# Patient Record
Sex: Female | Born: 1980 | Race: Black or African American | Hispanic: No | Marital: Single | Smoking: Never smoker
Health system: Southern US, Community
[De-identification: ages and names within clinical notes are randomized; demographics above are authoritative.]

## PROBLEM LIST (undated history)

## (undated) DIAGNOSIS — Z464 Encounter for fitting and adjustment of orthodontic device: Secondary | ICD-10-CM

## (undated) DIAGNOSIS — IMO0001 Reserved for inherently not codable concepts without codable children: Secondary | ICD-10-CM

## (undated) DIAGNOSIS — T753XXA Motion sickness, initial encounter: Secondary | ICD-10-CM

---

## 2002-01-04 ENCOUNTER — Emergency Department (HOSPITAL_COMMUNITY): Admission: EM | Admit: 2002-01-04 | Discharge: 2002-01-04 | Payer: Self-pay | Admitting: Emergency Medicine

## 2002-01-05 ENCOUNTER — Ambulatory Visit (HOSPITAL_COMMUNITY): Admission: RE | Admit: 2002-01-05 | Discharge: 2002-01-05 | Payer: Self-pay | Admitting: Emergency Medicine

## 2002-02-17 ENCOUNTER — Ambulatory Visit (HOSPITAL_COMMUNITY): Admission: RE | Admit: 2002-02-17 | Discharge: 2002-02-17 | Payer: Self-pay | Admitting: *Deleted

## 2003-03-24 ENCOUNTER — Emergency Department (HOSPITAL_COMMUNITY): Admission: AD | Admit: 2003-03-24 | Discharge: 2003-03-24 | Payer: Self-pay | Admitting: Emergency Medicine

## 2003-09-14 ENCOUNTER — Emergency Department (HOSPITAL_COMMUNITY): Admission: EM | Admit: 2003-09-14 | Discharge: 2003-09-14 | Payer: Self-pay | Admitting: Family Medicine

## 2003-10-06 ENCOUNTER — Emergency Department (HOSPITAL_COMMUNITY): Admission: EM | Admit: 2003-10-06 | Discharge: 2003-10-06 | Payer: Self-pay | Admitting: Family Medicine

## 2003-11-12 ENCOUNTER — Emergency Department (HOSPITAL_COMMUNITY): Admission: EM | Admit: 2003-11-12 | Discharge: 2003-11-12 | Payer: Self-pay | Admitting: Family Medicine

## 2003-11-22 ENCOUNTER — Emergency Department (HOSPITAL_COMMUNITY): Admission: EM | Admit: 2003-11-22 | Discharge: 2003-11-22 | Payer: Self-pay | Admitting: Family Medicine

## 2003-12-29 ENCOUNTER — Emergency Department (HOSPITAL_COMMUNITY): Admission: EM | Admit: 2003-12-29 | Discharge: 2003-12-29 | Payer: Self-pay | Admitting: Emergency Medicine

## 2004-05-16 ENCOUNTER — Emergency Department (HOSPITAL_COMMUNITY): Admission: EM | Admit: 2004-05-16 | Discharge: 2004-05-16 | Payer: Self-pay | Admitting: Family Medicine

## 2005-01-30 ENCOUNTER — Emergency Department (HOSPITAL_COMMUNITY): Admission: EM | Admit: 2005-01-30 | Discharge: 2005-01-30 | Payer: Self-pay | Admitting: Emergency Medicine

## 2005-11-17 IMAGING — CR DG FOOT COMPLETE 3+V*R*
3 series · 3 of 3 positions shown · non-contrast
Comparison: none

CLINICAL DATA: Foot pain.
 RIGHT FOOT - THREE VIEW:

[view not recorded (1 of 3)]
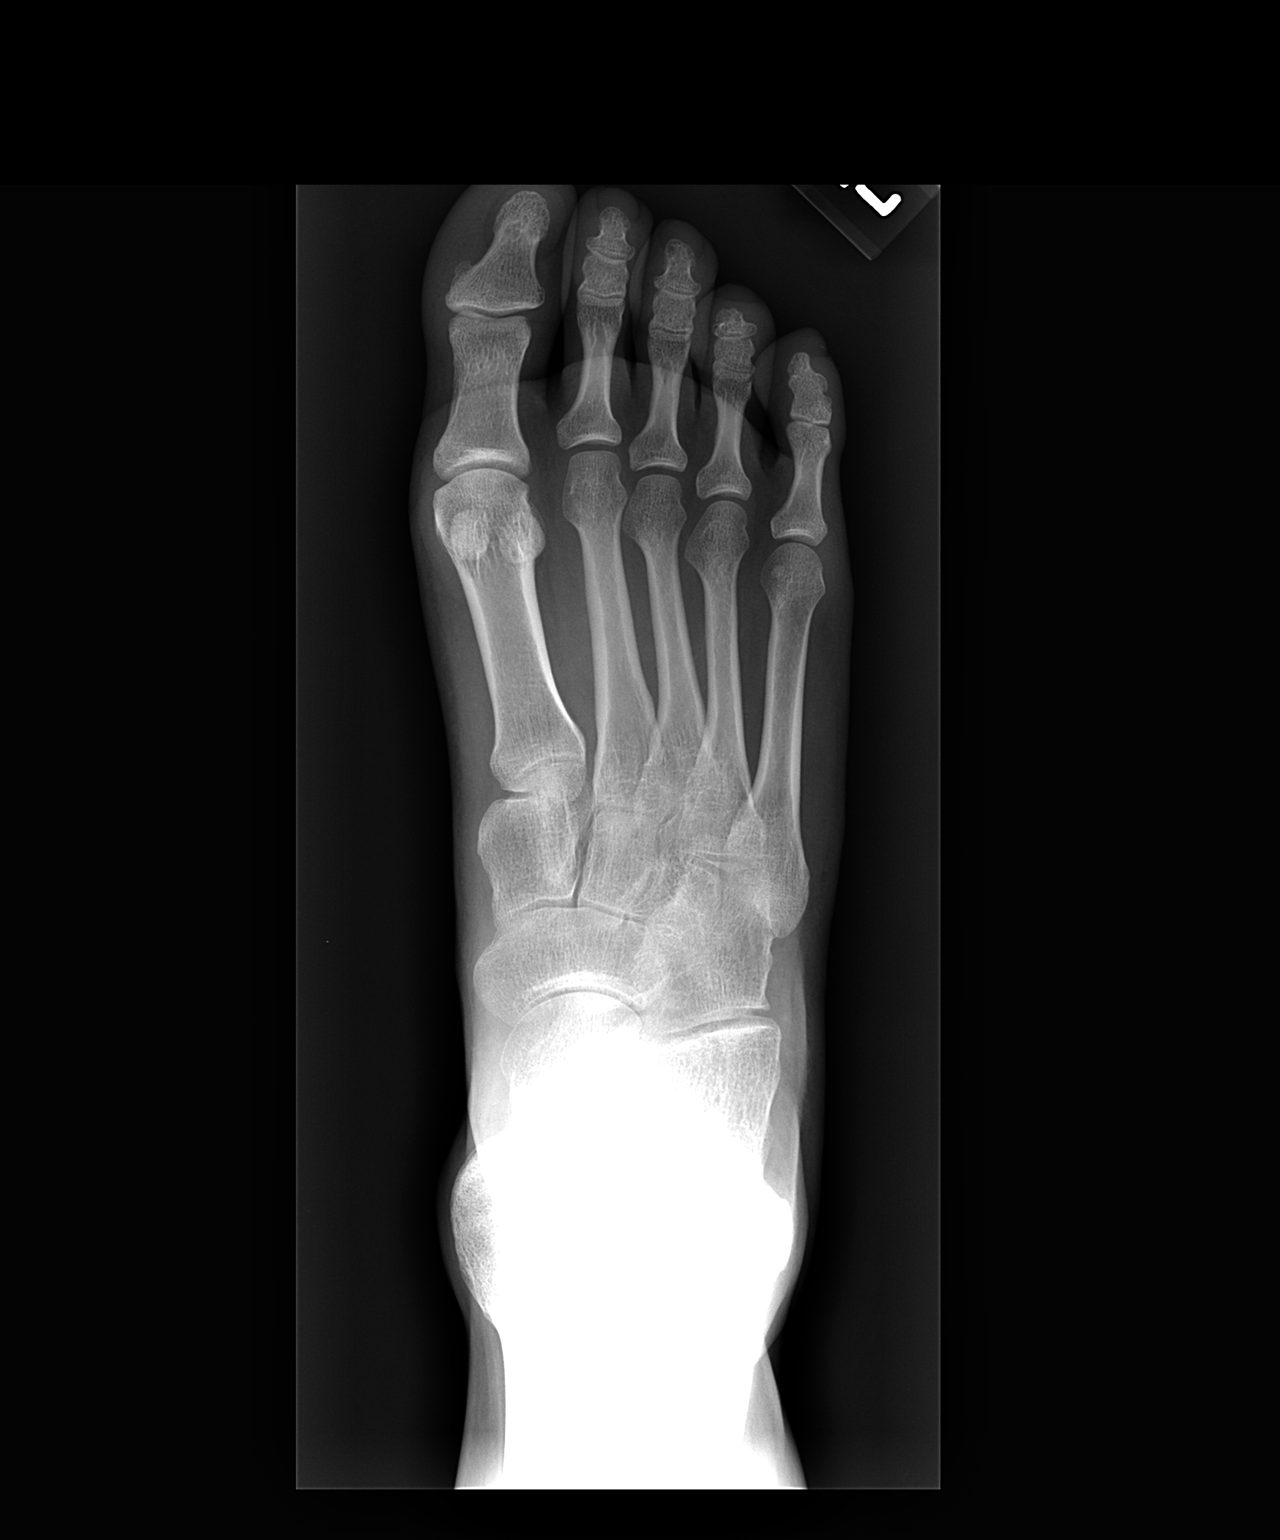

[view not recorded (2 of 3)]
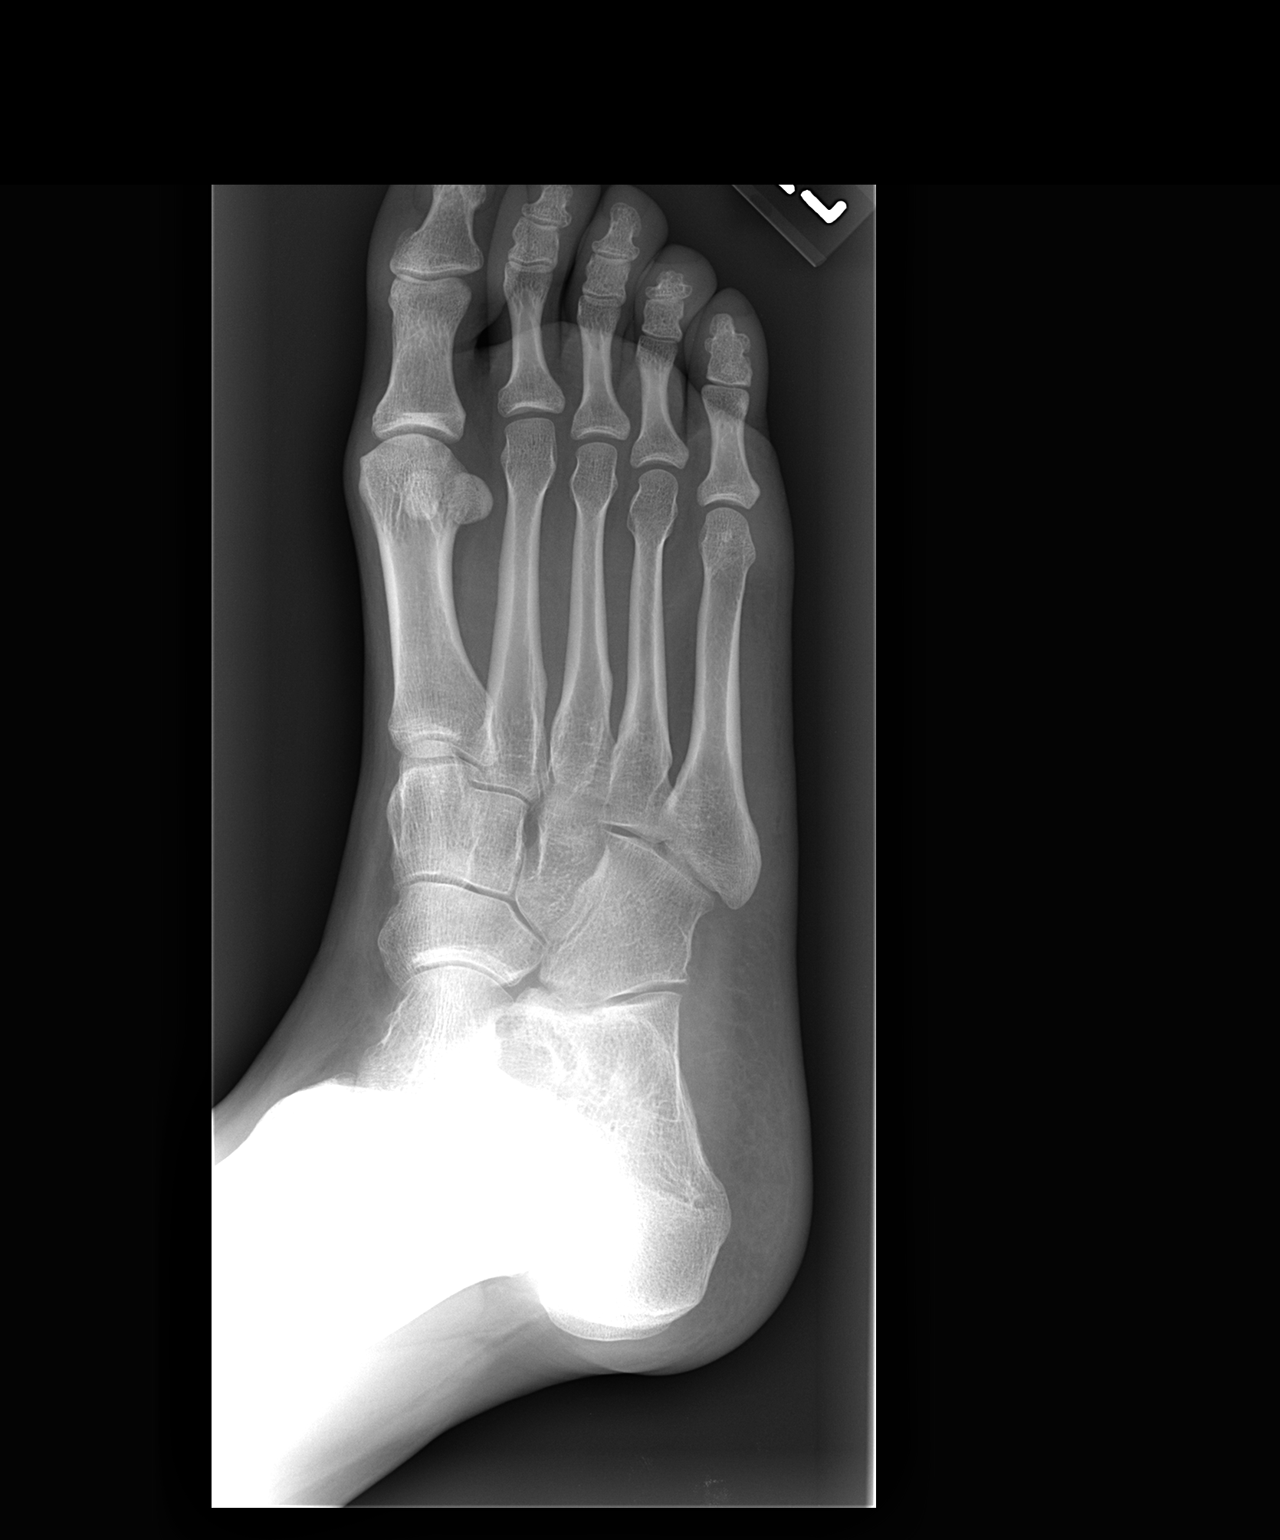

[view not recorded (3 of 3)]
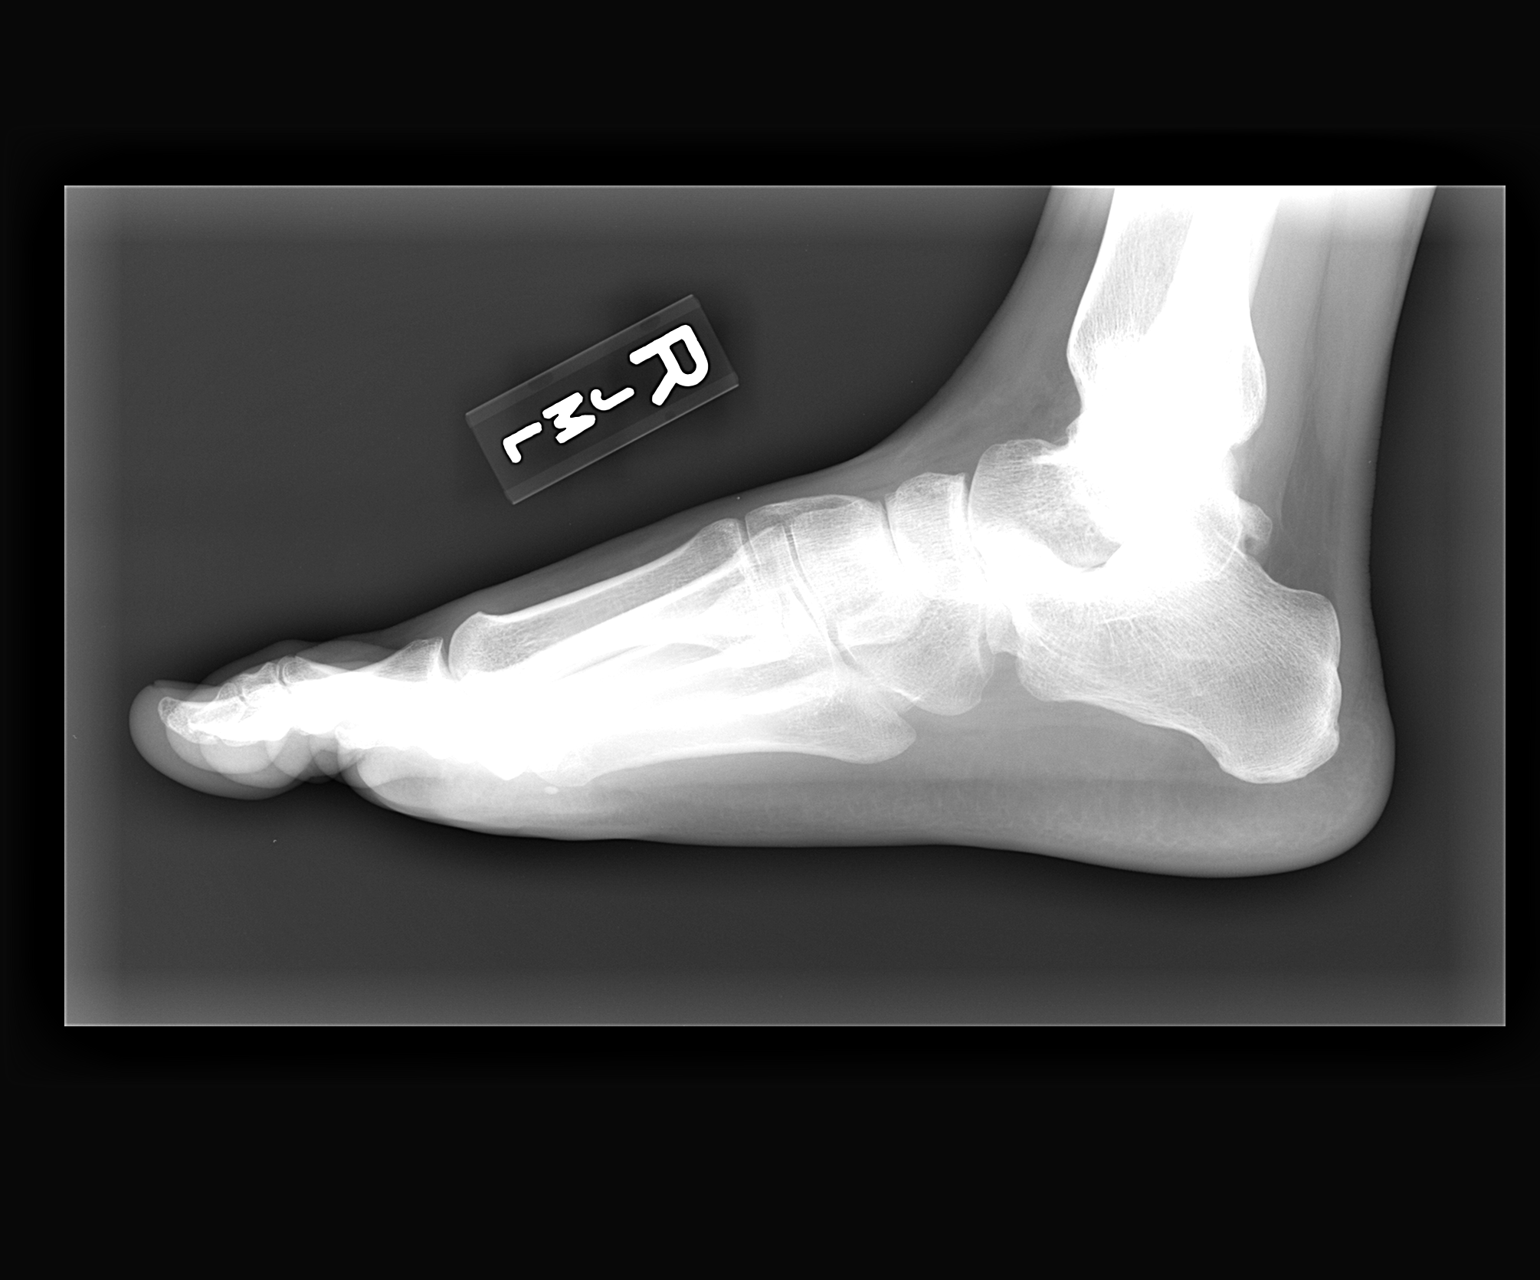

[3 of 3 positions shown; findings below may reference images not displayed]

FINDINGS: No acute fractures or dislocations are seen.  The soft tissues are within normal limits.
IMPRESSION: No evidence of acute bony injury.

## 2014-08-25 ENCOUNTER — Encounter: Payer: Self-pay | Admitting: *Deleted

## 2014-08-26 ENCOUNTER — Ambulatory Visit: Payer: Self-pay | Admitting: Anesthesiology

## 2014-08-26 ENCOUNTER — Ambulatory Visit
Admission: RE | Admit: 2014-08-26 | Discharge: 2014-08-26 | Disposition: A | Discharge status: Home / Self Care | Payer: Self-pay | Source: Ambulatory Visit | Attending: Plastic Surgery | Admitting: Plastic Surgery

## 2014-08-26 ENCOUNTER — Encounter
Admission: RE | Disposition: A | Discharge status: Home / Self Care | Payer: Self-pay | Source: Ambulatory Visit | Attending: Plastic Surgery

## 2014-08-26 DIAGNOSIS — Z882 Allergy status to sulfonamides status: Secondary | ICD-10-CM | POA: Insufficient documentation

## 2014-08-26 DIAGNOSIS — E881 Lipodystrophy, not elsewhere classified: Secondary | ICD-10-CM | POA: Insufficient documentation

## 2014-08-26 DIAGNOSIS — Z411 Encounter for cosmetic surgery: Secondary | ICD-10-CM | POA: Insufficient documentation

## 2014-08-26 HISTORY — PX: LIPOSUCTION: SHX10

## 2014-08-26 HISTORY — DX: Motion sickness, initial encounter: T75.3XXA

## 2014-08-26 HISTORY — DX: Reserved for inherently not codable concepts without codable children: IMO0001

## 2014-08-26 HISTORY — DX: Encounter for fitting and adjustment of orthodontic device: Z46.4

## 2014-08-26 SURGERY — LIPOSUCTION
Anesthesia: General | Wound class: Clean

## 2014-08-26 MED ORDER — LIDOCAINE HCL (PF) 2 % IJ SOLN
INTRAMUSCULAR | Status: DC | PRN
  Administered 2014-08-26 (×2): 25 mL via INTRADERMAL

## 2014-08-26 MED ORDER — DEXAMETHASONE SODIUM PHOSPHATE 4 MG/ML IJ SOLN
INTRAMUSCULAR | Status: DC | PRN
  Administered 2014-08-26: 4 mg via INTRAVENOUS

## 2014-08-26 MED ORDER — EPINEPHRINE HCL 1 MG/ML IJ SOLN
INTRAMUSCULAR | Status: DC | PRN
  Administered 2014-08-26 (×2): 1 mL

## 2014-08-26 MED ORDER — MIDAZOLAM HCL 5 MG/5ML IJ SOLN
INTRAMUSCULAR | Status: DC | PRN
  Administered 2014-08-26 (×2): 2 mg via INTRAVENOUS

## 2014-08-26 MED ORDER — OXYCODONE HCL 5 MG PO TABS: 5.0000 mg | ORAL_TABLET | Freq: Once | ORAL | Status: DC | PRN

## 2014-08-26 MED ORDER — LACTATED RINGERS IV SOLN
INTRAVENOUS | Status: DC
  Administered 2014-08-26 (×4): via INTRAVENOUS

## 2014-08-26 MED ORDER — FENTANYL CITRATE (PF) 100 MCG/2ML IJ SOLN
INTRAMUSCULAR | Status: DC | PRN
Start: 2014-08-26 — End: 2014-08-26
  Administered 2014-08-26: 50 ug via INTRAVENOUS
  Administered 2014-08-26: 100 ug via INTRAVENOUS
  Administered 2014-08-26 (×7): 50 ug via INTRAVENOUS

## 2014-08-26 MED ORDER — SUCCINYLCHOLINE CHLORIDE 20 MG/ML IJ SOLN
INTRAMUSCULAR | Status: DC | PRN
  Administered 2014-08-26: 100 mg via INTRAVENOUS

## 2014-08-26 MED ORDER — OXYCODONE HCL 5 MG/5ML PO SOLN: 5.0000 mg | Freq: Once | ORAL | Status: DC | PRN

## 2014-08-26 MED ORDER — CEFAZOLIN SODIUM 1-5 GM-% IV SOLN
INTRAVENOUS | Status: DC | PRN
  Administered 2014-08-26: 1 g via INTRAVENOUS

## 2014-08-26 MED ORDER — LIDOCAINE HCL (CARDIAC) 20 MG/ML IV SOLN
INTRAVENOUS | Status: DC | PRN
  Administered 2014-08-26: 50 mg via INTRAVENOUS

## 2014-08-26 MED ORDER — PROPOFOL 10 MG/ML IV BOLUS
INTRAVENOUS | Status: DC | PRN
  Administered 2014-08-26: 50 mg via INTRAVENOUS
  Administered 2014-08-26: 200 mg via INTRAVENOUS
  Administered 2014-08-26: 50 mg via INTRAVENOUS

## 2014-08-26 MED ORDER — HYDROMORPHONE HCL 1 MG/ML IJ SOLN
0.2500 mg | INTRAMUSCULAR | Status: DC | PRN
  Administered 2014-08-26: 0.5 mg via INTRAVENOUS

## 2014-08-26 MED ORDER — ONDANSETRON HCL 4 MG/2ML IJ SOLN
INTRAMUSCULAR | Status: DC | PRN
  Administered 2014-08-26: 4 mg via INTRAVENOUS

## 2014-08-26 MED ORDER — GLYCOPYRROLATE 0.2 MG/ML IJ SOLN
INTRAMUSCULAR | Status: DC | PRN
  Administered 2014-08-26: .1 mg via INTRAVENOUS

## 2014-08-26 MED ORDER — LACTATED RINGERS IV SOLN: 1000.0000 mL | INTRAVENOUS | Status: AC

## 2014-08-26 MED ORDER — PROPOFOL INFUSION 10 MG/ML OPTIME
INTRAVENOUS | Status: DC | PRN
  Administered 2014-08-26: 120 ug/kg/min via INTRAVENOUS

## 2014-08-26 SURGICAL SUPPLY — 36 items
BACTOSHIELD CHG 4% 4OZ (MISCELLANEOUS) ×16
BLADE SURG SZ11 CARB STEEL (BLADE) ×6 IMPLANT
CANISTER SUCT 1200ML W/VALVE (MISCELLANEOUS) ×3 IMPLANT
CANISTER SUCT 3000ML PPV (MISCELLANEOUS) ×5 IMPLANT
CLOSURE WOUND 1/2 X4 (GAUZE/BANDAGES/DRESSINGS) ×4
DRAPE LAPAROSCOPIC ABDOMINAL (DRAPES) ×3 IMPLANT
DRAPE ORTHO SPLIT 77X108 STRL (DRAPES) ×12
DRAPE SHEET LG 3/4 BI-LAMINATE (DRAPES) ×12 IMPLANT
DRAPE SURG ORHT 6 SPLT 77X108 (DRAPES) ×4 IMPLANT
DRSG TEGADERM 2-3/8X2-3/4 SM (GAUZE/BANDAGES/DRESSINGS) ×3 IMPLANT
DRSG TEGADERM 4X4.75 (GAUZE/BANDAGES/DRESSINGS) ×21 IMPLANT
GLOVE BIO SURGEON STRL SZ 6.5 (GLOVE) ×4 IMPLANT
GLOVE BIO SURGEON STRL SZ7.5 (GLOVE) ×10 IMPLANT
GLOVE BIO SURGEONS STRL SZ 6.5 (GLOVE) ×2
GOWN STRL REUS W/ TWL LRG LVL3 (GOWN DISPOSABLE) ×3 IMPLANT
GOWN STRL REUS W/TWL LRG LVL3 (GOWN DISPOSABLE) ×12
LIQUID BAND (GAUZE/BANDAGES/DRESSINGS) ×6 IMPLANT
MARKER SKIN SURG W/RULER VIO (MISCELLANEOUS) ×6 IMPLANT
NDL FILTER BLUNT 18X1 1/2 (NEEDLE) ×2 IMPLANT
NEEDLE FILTER BLUNT 18X 1/2SAF (NEEDLE) ×8
NEEDLE FILTER BLUNT 18X1 1/2 (NEEDLE) ×4 IMPLANT
NS IRRIG 1000ML POUR BTL (IV SOLUTION) ×3 IMPLANT
NS IRRIG 500ML POUR BTL (IV SOLUTION) ×1 IMPLANT
PACK BASIN MINOR ARMC (MISCELLANEOUS) ×3 IMPLANT
PAD GROUND ADULT SPLIT (MISCELLANEOUS) ×1 IMPLANT
SCRUB CHG 4% DYNA-HEX 4OZ (MISCELLANEOUS) ×4 IMPLANT
SPONGE LAP 18X18 5 PK (GAUZE/BANDAGES/DRESSINGS) ×13 IMPLANT
STAPLER SKIN PROX 35W (STAPLE) ×3 IMPLANT
STRIP CLOSURE SKIN 1/2X4 (GAUZE/BANDAGES/DRESSINGS) ×8 IMPLANT
SYR 50ML LL SCALE MARK (SYRINGE) ×55 IMPLANT
SYR BULB IRRIG 60ML STRL (SYRINGE) ×1 IMPLANT
SYRINGE 10CC LL (SYRINGE) ×24 IMPLANT
TAPE MICROPORE 2IN (TAPE) ×3 IMPLANT
TOWEL OR 17X26 4PK STRL BLUE (TOWEL DISPOSABLE) ×15 IMPLANT
UNDERPAD 30X60 958B10 (PK) (MISCELLANEOUS) ×12 IMPLANT
WATER STERILE IRR 500ML POUR (IV SOLUTION) ×2 IMPLANT

## 2014-08-26 NOTE — Anesthesia Postprocedure Evaluation (Signed)
  Anesthesia Post-op Note  Patient: Marrion CoyLilian Lengyel  Procedure(s) Performed: Procedure(s) with comments: LIPOSUCTION with autologous Fat Transfer to Buttocks (N/A) - AUTOLOGOUS FAT TRANSFER TO BUTTOCKS  Anesthesia type:General  Patient location: PACU  Post pain: Pain level controlled  Post assessment: Post-op Vital signs reviewed, Patient's Cardiovascular Status Stable, Respiratory Function Stable, Patent Airway and No signs of Nausea or vomiting  Post vital signs: Reviewed and stable  Last Vitals:  Filed Vitals:   08/26/14 1454  BP: 122/99  Pulse: 98  Temp: 35.5 C  Resp: 28    Level of consciousness: awake, alert  and patient cooperative  Complications: No apparent anesthesia complications

## 2014-08-26 NOTE — Transfer of Care (Signed)
Immediate Anesthesia Transfer of Care Note  Patient: Sarah Allison  Procedure(s) Performed: Procedure(s) with comments: LIPOSUCTION with autologous Fat Transfer to Buttocks (N/A) - AUTOLOGOUS FAT TRANSFER TO BUTTOCKS  Patient Location: PACU  Anesthesia Type: General  Level of Consciousness: drowsy, alert  and patient cooperative  Airway and Oxygen Therapy: Patient Spontanous Breathing and Patient connected to supplemental oxygen  Post-op Assessment: Post-op Vital signs reviewed, Patient's Cardiovascular Status Stable, Respiratory Function Stable, Patent Airway and No signs of Nausea or vomiting  Post-op Vital Signs: Reviewed and stable  Complications: No apparent anesthesia complications

## 2014-08-26 NOTE — Anesthesia Preprocedure Evaluation (Addendum)
Anesthesia Evaluation  Patient identified by MRN, date of birth, ID band  Reviewed: Allergy & Precautions, H&P , NPO status , Patient's Chart, lab work & pertinent test results  History of Anesthesia Complications Negative for: history of anesthetic complications  Airway Mallampati: II  TM Distance: >3 FB Neck ROM: full    Dental no notable dental hx.  Braces :   Pulmonary    Pulmonary exam normal       Cardiovascular Exercise Tolerance: Good Rhythm:regular Rate:Normal     Neuro/Psych    GI/Hepatic   Endo/Other    Renal/GU      Musculoskeletal   Abdominal   Peds  Hematology   Anesthesia Other Findings   Reproductive/Obstetrics negative OB ROS                             Anesthesia Physical Anesthesia Plan  ASA: I  Anesthesia Plan: General   Post-op Pain Management:    Induction:   Airway Management Planned:   Additional Equipment:   Intra-op Plan:   Post-operative Plan:   Informed Consent: I have reviewed the patients History and Physical, chart, labs and discussed the procedure including the risks, benefits and alternatives for the proposed anesthesia with the patient or authorized representative who has indicated his/her understanding and acceptance.     Plan Discussed with: CRNA  Anesthesia Plan Comments: (Surgeon prefers TIVA, if possible. +/- Bis monitor.)       Anesthesia Quick Evaluation

## 2014-08-26 NOTE — Discharge Instructions (Signed)

## 2014-08-26 NOTE — H&P (Signed)
  The current H&P has been reviewed and updated. It will be scanned in at a later date.  

## 2014-08-26 NOTE — Op Note (Signed)
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 Staff surgeon: Caryl AspBrian Jamera Vanloan Anesthesia: Gen. plus tumescent (2 L in the abdomen, 800 cc divided equally in the banana rolls, 3.9 L in the flanks and upper back) EBL:  minimal (labral aspirate 2 L from the abdomen, 2.5 L in the back and flanks, 250 cc per side from the banana roll) Findings: Stable with good contour completion of procedure Disposition: Stable to recovery Drains: None  Statement of necessity: Patient is a very pleasant 34 year old female who presents with cosmetic concern for a desire for a rounder shape to her buttocks. She also finds that she has excess adiposity in the abdomen flanks and upper back as potential donor sites. We discussed the procedure and the specific goals. We discussed the areas to be treated and realistic expectations. We have discussed and utilized [facility] consent forms and reviewed the common and uncommon complications. We have discussed the possibility for fat resorption, asymmetry, fat necrosis, bruising and hyperpigmentation in the possible need for secondary surgeries. All of her questions have been answered and she has requested procedure as described.  Statement of procedure: Patient brought to the operating room awake alert and transferred to the OR table and intubated without difficulty. SCDs were on and functioning and she was prepped and draped in the usual sterile fashion with both arms outstretched and bony prominences padded. Several stab incisions were made over the abdomen and tumescent fluid was infiltrated over the abdominal fat pads in the deep compartment and over the bilateral anterior flanks. A full 15 minutes was allowed to elapse during which time the epinephrine took effect and then a 3 mm 3 hole  cannula was utilized with the holes always pointing away from the dermis. Under low-level suction fat was collected in a sterile containment unit and allowed to separate. Judicious liposuction was performed over the entire anterior abdomen. A total of 2 L of tumescent was infiltrated and just under 2 L was removed. A 4 mm basket Was then utilized to perform distribution without suction and create a smooth contour and checked for any irregularities. Pinch thickness also confirmed a smooth contour and at this point the incisions were closed with 4-0 Monocryl and sterile dressings were applied.  The patient was then carefully rolled into the prone position after inserting a Foley catheter. SCDs continued on and functioning. Bony prominences were padded arms were outstretched face was protected she was then prepped and draped in the usual sterile fashion.  Several stab incisions were made of low back and upper back and inferior gluteal region. Tumescent fluid was then infiltrated. A total of 3.9 L was infiltrated over the back and flanks and an additional 800 cc infiltrated in the banana rolls. An additional 15 minutes was allowed to elapse and then a 4 mm basket cannula was used to perform judicious liposuction and compartment and then more cautiously in the superficial compartment over the bilateral flanks and upper back. Care was taken to contour along the latissimus muscle edge and along the low back. The banana roll and the saddlebag was also aspirated for a total of 250 cc per side. Aspiration had very minimal blood loss. The fat was carefully transferred to 60 cc syringes and centrifuged and separated. Blood and tumescent fluid were separated and liquefied fat as well were removed.  Finally tear fat was transferred to a 2 mm blunt tip side port injection cannula and transfer of fat to the buttocks was performed in 50 cc syringes. The entire system was closed. At each step the injections were performed always  with withdrawing. No complications were noted. The fat was then distributed with a 4 mm basket through the buttocks. The contour was checked several times and ultimately a total of 750 cc was injected onto the right buttock and 700 cc on the left. All incisions were closed. Needle sponge and lap counts are correct. She tolerated the procedure well. There are no apparent complications.

## 2014-08-27 ENCOUNTER — Encounter: Payer: Self-pay | Admitting: Plastic Surgery
